# Patient Record
Sex: Male | Born: 1955 | Race: White | Hispanic: No | Marital: Married | Smoking: Former smoker
Health system: Southern US, Community
[De-identification: ages and names within clinical notes are randomized; demographics above are authoritative.]

## PROBLEM LIST (undated history)

## (undated) DIAGNOSIS — E785 Hyperlipidemia, unspecified: Secondary | ICD-10-CM

## (undated) HISTORY — PX: HIP SURGERY: SHX245

## (undated) HISTORY — PX: OTHER SURGICAL HISTORY: SHX169

---

## 2021-06-05 ENCOUNTER — Encounter (HOSPITAL_COMMUNITY): Payer: Self-pay | Admitting: Emergency Medicine

## 2021-06-05 ENCOUNTER — Emergency Department (HOSPITAL_COMMUNITY): Payer: No Typology Code available for payment source

## 2021-06-05 ENCOUNTER — Emergency Department (HOSPITAL_COMMUNITY)
Admission: EM | Admit: 2021-06-05 | Discharge: 2021-06-05 | Disposition: A | Discharge status: Home / Self Care | Payer: No Typology Code available for payment source | Attending: Emergency Medicine | Admitting: Emergency Medicine

## 2021-06-05 ENCOUNTER — Other Ambulatory Visit: Payer: Self-pay

## 2021-06-05 DIAGNOSIS — S8992XA Unspecified injury of left lower leg, initial encounter: Secondary | ICD-10-CM | POA: Diagnosis present

## 2021-06-05 DIAGNOSIS — Y92410 Unspecified street and highway as the place of occurrence of the external cause: Secondary | ICD-10-CM | POA: Diagnosis not present

## 2021-06-05 DIAGNOSIS — Z23 Encounter for immunization: Secondary | ICD-10-CM | POA: Insufficient documentation

## 2021-06-05 DIAGNOSIS — S81812A Laceration without foreign body, left lower leg, initial encounter: Secondary | ICD-10-CM | POA: Diagnosis not present

## 2021-06-05 DIAGNOSIS — Z87891 Personal history of nicotine dependence: Secondary | ICD-10-CM | POA: Insufficient documentation

## 2021-06-05 MED ORDER — TETANUS-DIPHTH-ACELL PERTUSSIS 5-2.5-18.5 LF-MCG/0.5 IM SUSY
0.5000 mL | PREFILLED_SYRINGE | Freq: Once | INTRAMUSCULAR | Status: AC
  Administered 2021-06-05: 0.5 mL via INTRAMUSCULAR
  Filled 2021-06-05: qty 0.5
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
## 2021-06-05 NOTE — Discharge Instructions (Addendum)
You were evaluated in the Emergency Department and after careful evaluation, we did not find any emergent condition requiring admission or further testing in the hospital.  Your exam/testing today was overall reassuring.  X-ray did not show any broken bones or emergencies.  We repaired your laceration here in the emergency department.  You will need your sutures removed in 7 to 10 days by healthcare professional.  Please return to the Emergency Department if you experience any worsening of your condition.  Thank you for allowing [place] to be a part of your care.
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
## 2021-06-20 ENCOUNTER — Encounter: Payer: Self-pay | Admitting: Emergency Medicine

## 2021-06-20 ENCOUNTER — Ambulatory Visit
Admission: EM | Admit: 2021-06-20 | Discharge: 2021-06-20 | Disposition: A | Discharge status: Home / Self Care | Payer: No Typology Code available for payment source

## 2021-06-20 ENCOUNTER — Other Ambulatory Visit: Payer: Self-pay

## 2021-06-20 ENCOUNTER — Ambulatory Visit (INDEPENDENT_AMBULATORY_CARE_PROVIDER_SITE_OTHER): Payer: No Typology Code available for payment source

## 2021-06-20 DIAGNOSIS — R079 Chest pain, unspecified: Secondary | ICD-10-CM

## 2021-06-20 DIAGNOSIS — R0781 Pleurodynia: Secondary | ICD-10-CM
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
## 2021-06-20 NOTE — Discharge Instructions (Addendum)
Your x-ray today was negative for any injury or lung involvement  Can take 600 to 800 mg of ibuprofen every 8 hours with food as needed for pain management  May also use over-the-counter muscle rub such as lidocaine or icy hot for additional comfort  Can place heat or ice over the affected area and 15-minute intervals for additional comfort  Pain should continue to improve over time

## 2021-07-04 ENCOUNTER — Other Ambulatory Visit: Payer: Self-pay

## 2021-07-04 ENCOUNTER — Ambulatory Visit
Admission: EM | Admit: 2021-07-04 | Discharge: 2021-07-04 | Disposition: A | Discharge status: Home / Self Care | Payer: No Typology Code available for payment source | Attending: Internal Medicine | Admitting: Internal Medicine

## 2021-07-04 DIAGNOSIS — L03116 Cellulitis of left lower limb: Secondary | ICD-10-CM | POA: Diagnosis not present

## 2021-07-04 HISTORY — DX: Hyperlipidemia, unspecified: E78.5

## 2021-07-04 MED ORDER — CEPHALEXIN 500 MG PO CAPS: 500.0000 mg | ORAL_CAPSULE | Freq: Four times a day (QID) | ORAL | 0 refills | Status: DC

## 2021-07-04 NOTE — Discharge Instructions (Addendum)
You have been prescribed cephalexin antibiotic for left leg infection. Please follow up if no improvement in leg appearance. Go to the hospital if appearance of leg becomes worse.
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
## 2021-07-10 ENCOUNTER — Encounter (HOSPITAL_COMMUNITY): Payer: Self-pay | Admitting: *Deleted

## 2021-07-10 ENCOUNTER — Inpatient Hospital Stay (HOSPITAL_COMMUNITY)
Admission: EM | Admit: 2021-07-10 | Discharge: 2021-07-13 | DRG: 602 | Disposition: A | Discharge status: Home / Self Care | Payer: No Typology Code available for payment source | Attending: Family Medicine | Admitting: Family Medicine

## 2021-07-10 ENCOUNTER — Other Ambulatory Visit: Payer: Self-pay

## 2021-07-10 DIAGNOSIS — S81812S Laceration without foreign body, left lower leg, sequela: Secondary | ICD-10-CM | POA: Diagnosis not present

## 2021-07-10 DIAGNOSIS — E782 Mixed hyperlipidemia: Secondary | ICD-10-CM

## 2021-07-10 DIAGNOSIS — L03116 Cellulitis of left lower limb: Principal | ICD-10-CM | POA: Diagnosis present

## 2021-07-10 DIAGNOSIS — Z87891 Personal history of nicotine dependence: Secondary | ICD-10-CM

## 2021-07-10 DIAGNOSIS — Z6841 Body Mass Index (BMI) 40.0 and over, adult: Secondary | ICD-10-CM

## 2021-07-10 DIAGNOSIS — E785 Hyperlipidemia, unspecified: Secondary | ICD-10-CM | POA: Diagnosis present

## 2021-07-10 DIAGNOSIS — D72829 Elevated white blood cell count, unspecified: Secondary | ICD-10-CM

## 2021-07-10 DIAGNOSIS — U071 COVID-19: Secondary | ICD-10-CM | POA: Diagnosis present

## 2021-07-10 DIAGNOSIS — S81802A Unspecified open wound, left lower leg, initial encounter: Secondary | ICD-10-CM

## 2021-07-10 DIAGNOSIS — Z79899 Other long term (current) drug therapy: Secondary | ICD-10-CM

## 2021-07-10 LAB — CBC
HCT: 44.8 % (ref 39.0–52.0)
Hemoglobin: 14.5 g/dL (ref 13.0–17.0)
MCH: 29.2 pg (ref 26.0–34.0)
MCHC: 32.4 g/dL (ref 30.0–36.0)
MCV: 90.1 fL (ref 80.0–100.0)
Platelets: 371 10*3/uL (ref 150–400)
RBC: 4.97 MIL/uL (ref 4.22–5.81)
RDW: 13.6 % (ref 11.5–15.5)
WBC: 13.2 10*3/uL — ABNORMAL HIGH (ref 4.0–10.5)
nRBC: 0 % (ref 0.0–0.2)

## 2021-07-10 LAB — BASIC METABOLIC PANEL
Anion gap: 6 (ref 5–15)
BUN: 16 mg/dL (ref 8–23)
CO2: 27 mmol/L (ref 22–32)
Calcium: 9.3 mg/dL (ref 8.9–10.3)
Chloride: 102 mmol/L (ref 98–111)
Creatinine, Ser: 0.81 mg/dL (ref 0.61–1.24)
GFR, Estimated: >60 mL/min (ref >60)
Glucose, Bld: 94 mg/dL (ref 70–99)
Potassium: 4.2 mmol/L (ref 3.5–5.1)
Sodium: 135 mmol/L (ref 135–145)

## 2021-07-10 MED ORDER — SODIUM CHLORIDE 0.9 % IV SOLN
2.0000 g | INTRAVENOUS | Status: DC
  Administered 2021-07-10 – 2021-07-12 (×3): 2 g via INTRAVENOUS
  Filled 2021-07-10 (×3): qty 20

## 2021-07-10 MED ORDER — VANCOMYCIN HCL IN DEXTROSE 1-5 GM/200ML-% IV SOLN
1000.0000 mg | Freq: Once | INTRAVENOUS | Status: AC
  Administered 2021-07-10: 1000 mg via INTRAVENOUS
  Filled 2021-07-10: qty 200

## 2021-07-10 MED ORDER — ATORVASTATIN CALCIUM 20 MG PO TABS: 20.0000 mg | ORAL_TABLET | Freq: Every day | ORAL | Status: DC

## 2021-07-10 MED ORDER — ENOXAPARIN SODIUM 80 MG/0.8ML IJ SOSY
70.0000 mg | PREFILLED_SYRINGE | INTRAMUSCULAR | Status: DC
  Administered 2021-07-10 – 2021-07-12 (×3): 70 mg via SUBCUTANEOUS
  Filled 2021-07-10 (×3): qty 0.8

## 2021-07-10 MED ORDER — PIPERACILLIN-TAZOBACTAM 3.375 G IVPB 30 MIN
3.3750 g | Freq: Once | INTRAVENOUS | Status: AC
  Administered 2021-07-10: 3.375 g via INTRAVENOUS
  Filled 2021-07-10: qty 50

## 2021-07-10 MED ORDER — ENOXAPARIN SODIUM 40 MG/0.4ML IJ SOSY: 40.0000 mg | PREFILLED_SYRINGE | INTRAMUSCULAR | Status: DC
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
## 2021-07-10 NOTE — ED Provider Notes (Signed)
Pt given IV antibiotics.  I spoke with Hospitalist who will see for admission    George Reese 07/10/21 1925    Cathren Laine, MD 07/11/21 1332
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
## 2021-07-11 ENCOUNTER — Inpatient Hospital Stay (HOSPITAL_COMMUNITY): Payer: No Typology Code available for payment source

## 2021-07-11 ENCOUNTER — Encounter (HOSPITAL_COMMUNITY): Payer: Self-pay | Admitting: Internal Medicine

## 2021-07-11 LAB — CBC
HCT: 43.2 % (ref 39.0–52.0)
Hemoglobin: 14.1 g/dL (ref 13.0–17.0)
MCH: 29.4 pg (ref 26.0–34.0)
MCHC: 32.6 g/dL (ref 30.0–36.0)
MCV: 90.2 fL (ref 80.0–100.0)
Platelets: 370 10*3/uL (ref 150–400)
RBC: 4.79 MIL/uL (ref 4.22–5.81)
RDW: 13.7 % (ref 11.5–15.5)
WBC: 13.4 10*3/uL — ABNORMAL HIGH (ref 4.0–10.5)
nRBC: 0 % (ref 0.0–0.2)

## 2021-07-11 LAB — COMPREHENSIVE METABOLIC PANEL
ALT: 33 U/L (ref 0–44)
AST: 19 U/L (ref 15–41)
Albumin: 3.7 g/dL (ref 3.5–5.0)
Alkaline Phosphatase: 52 U/L (ref 38–126)
Anion gap: 9 (ref 5–15)
BUN: 13 mg/dL (ref 8–23)
CO2: 26 mmol/L (ref 22–32)
Calcium: 9.2 mg/dL (ref 8.9–10.3)
Chloride: 102 mmol/L (ref 98–111)
Creatinine, Ser: 0.84 mg/dL (ref 0.61–1.24)
GFR, Estimated: >60 mL/min (ref >60)
Glucose, Bld: 118 mg/dL — ABNORMAL HIGH (ref 70–99)
Potassium: 4.2 mmol/L (ref 3.5–5.1)
Sodium: 137 mmol/L (ref 135–145)
Total Bilirubin: 0.5 mg/dL (ref 0.3–1.2)
Total Protein: 7.5 g/dL (ref 6.5–8.1)

## 2021-07-11 LAB — APTT: aPTT: 30 seconds (ref 24–36)

## 2021-07-11 LAB — PHOSPHORUS: Phosphorus: 4.1 mg/dL (ref 2.5–4.6)

## 2021-07-11 LAB — PREALBUMIN: Prealbumin: 20.5 mg/dL (ref 18–38)

## 2021-07-11 LAB — SARS CORONAVIRUS 2 (TAT 6-24 HRS): SARS Coronavirus 2: POSITIVE — AB

## 2021-07-11 LAB — C-REACTIVE PROTEIN: CRP: 1.8 mg/dL — ABNORMAL HIGH (ref <1.0)

## 2021-07-11 LAB — PROTIME-INR
INR: 1 (ref 0.8–1.2)
Prothrombin Time: 13.4 seconds (ref 11.4–15.2)

## 2021-07-11 LAB — SEDIMENTATION RATE: Sed Rate: 37 mm/hr — ABNORMAL HIGH (ref 0–16)

## 2021-07-11 LAB — MAGNESIUM: Magnesium: 2.3 mg/dL (ref 1.7–2.4)

## 2021-07-11 LAB — HIV ANTIBODY (ROUTINE TESTING W REFLEX): HIV Screen 4th Generation wRfx: NONREACTIVE

## 2021-07-11 MED ORDER — SODIUM CHLORIDE 0.9 % IV SOLN: 2.0000 g | INTRAVENOUS | Status: DC

## 2021-07-11 MED ORDER — NIRMATRELVIR/RITONAVIR (PAXLOVID)TABLET
3.0000 | ORAL_TABLET | Freq: Two times a day (BID) | ORAL | Status: DC
  Administered 2021-07-11 – 2021-07-13 (×4): 3 via ORAL
  Filled 2021-07-11: qty 30

## 2021-07-11 MED ORDER — PROSOURCE PLUS PO LIQD
30.0000 mL | Freq: Three times a day (TID) | ORAL | Status: DC
  Administered 2021-07-11 – 2021-07-13 (×6): 30 mL via ORAL
  Filled 2021-07-11 (×6): qty 30

## 2021-07-11 MED ORDER — METRONIDAZOLE 500 MG PO TABS
500.0000 mg | ORAL_TABLET | Freq: Two times a day (BID) | ORAL | Status: DC
Start: 2021-07-11 — End: 2021-07-13
  Administered 2021-07-11 – 2021-07-13 (×5): 500 mg via ORAL
  Filled 2021-07-11 (×5): qty 1

## 2021-07-11 MED ORDER — VANCOMYCIN HCL 1250 MG/250ML IV SOLN
1250.0000 mg | Freq: Two times a day (BID) | INTRAVENOUS | Status: DC
  Administered 2021-07-11 – 2021-07-13 (×5): 1250 mg via INTRAVENOUS
  Filled 2021-07-11 (×5): qty 250

## 2021-07-11 MED ORDER — JUVEN PO PACK
1.0000 | PACK | Freq: Two times a day (BID) | ORAL | Status: DC
  Administered 2021-07-11 – 2021-07-13 (×4): 1 via ORAL
  Filled 2021-07-11 (×4): qty 1

## 2021-07-11 NOTE — Progress Notes (Signed)
Initial Nutrition Assessment  DOCUMENTATION CODES:   Morbid obesity  INTERVENTION:  -1 packet Juven BID to support wound healing   -ProSource Plus 30 ml TID (each 30 ml provides 100 kcal, 15 gr protein)   NUTRITION DIAGNOSIS:   Increased nutrient needs related to wound healing as evidenced by estimated needs.   GOAL:  Patient will meet greater than or equal to 90% of their needs   MONITOR:  PO intake, Supplement acceptance, Labs, Skin, Weight trends  REASON FOR ASSESSMENT:   Consult Wound healing  ASSESSMENT: Patient is a 65 yo male with history of obesity, hyperlipidemia. He presents with a left lower leg wound s/p failure of outpatient treatment per MD.    Patient has a very good appetite and intake reported 100% of meals consumed. No barriers identified to meeting estimated nutrition needs. Emphasized the importance of healthy diet and lean proteins.   Weight range 132-136 kg. Current wt. 134.8 kg. Weight loss would be beneficial.   Medications reviewed.  Labs: BMP Latest Ref Rng & Units 07/11/2021 07/10/2021  Glucose 70 - 99 mg/dL 606(T) 94  BUN 8 - 23 mg/dL 13 16  Creatinine 0.16 - 1.24 mg/dL 0.10 9.32  Sodium 355 - 145 mmol/L 137 135  Potassium 3.5 - 5.1 mmol/L 4.2 4.2  Chloride 98 - 111 mmol/L 102 102  CO2 22 - 32 mmol/L 26 27  Calcium 8.9 - 10.3 mg/dL 9.2 9.3     NUTRITION - FOCUSED PHYSICAL EXAM: Nutrition-Focused physical exam completed. Findings are no fat depletion, no muscle depletion, and left lower leg edema.    Diet Order:   Diet Order             Diet Heart Room service appropriate? Yes; Fluid consistency: Thin  Diet effective now                   EDUCATION NEEDS:  Education needs have been addressed  Skin:  Skin Assessment: Skin Integrity Issues: Skin Integrity Issues:: Other (Comment) Other: wound left lower leg  Last BM:  8/29  Height:   Ht Readings from Last 1 Encounters:  07/10/21 5\' 8"  (1.727 m)    Weight:   Wt  Readings from Last 1 Encounters:  07/10/21 134.8 kg    Ideal Body Weight:   64 kg  BMI:  Body mass index is 45.19 kg/m.  Estimated Nutritional Needs:   Kcal:  2300-2500  Protein:  130-145 gr  Fluid:  2.3-2.5 liters daily   07/12/21 MS,RD,CSG,LDN Contact: Royann Shivers

## 2021-07-11 NOTE — Progress Notes (Signed)
Pharmacy Antibiotic Note  George Reese is a 65 y.o. 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  Pharmacy has been consulted for Vanco dosing.  Plan: Vancomycin 1250 mg IV every 12 hours. Monitor labs, c/s, and vanco level as indicated.  Height: 5\' 8"  (172.7 cm) Weight: 134.8 kg (297 lb 2.9 oz) IBW/kg (Calculated) : 68.4  Temp (24hrs), Avg:98.3 F (36.8 C), Min:98.1 F (36.7 C), Max:98.5 F (36.9 C)  Recent Labs  Lab 07/10/21 1728 07/11/21 0500  WBC 13.2* 13.4*  CREATININE 0.81 0.84    Estimated Creatinine Clearance: 120.9 mL/min (by C-G formula based on SCr of 0.84 mg/dL).    No Known Allergies  Antimicrobials this admission: Vanco 8/30 >>  CTX 8/29 >> Flagyl 8/30    Thank you for allowing pharmacy to be a part of this patient's care.  9/30 07/11/2021 10:55 AM

## 2021-07-11 NOTE — Progress Notes (Signed)
Inpatient Diabetes Program Recommendations  AACE/ADA: New Consensus Statement on Inpatient Glycemic Control  Target Ranges:  Prepandial:   less than 140 mg/dL      Peak postprandial:   less than 180 mg/dL (1-2 hours)      Critically ill patients:  140 - 180 mg/dL  Results for George Reese, George Reese (MRN [id number]) as of 07/11/2021 08:43  Ref. Range 07/10/2021 17:28 07/11/2021 05:00  Glucose Latest Ref Range: 70 - 99 mg/dL 94 325 (H)   Review of Glycemic Control  Diabetes history: NO Outpatient Diabetes medications: NA Current orders for Inpatient glycemic control: None  NOTE: Noted consult for diabetes coordinator per lower extremity wound order set. Per chart, patient does not have any hx of DM. Will sign off consult. Please reconsult if needs arise.  Thanks, Orlando Penner, RN, MSN, CDE Diabetes Coordinator Inpatient Diabetes Program [phone] (Team Pager from 8am to 5pm)
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
## 2021-07-12 LAB — CBC
HCT: 45.5 % (ref 39.0–52.0)
Hemoglobin: 14.7 g/dL (ref 13.0–17.0)
MCH: 29.5 pg (ref 26.0–34.0)
MCHC: 32.3 g/dL (ref 30.0–36.0)
MCV: 91.2 fL (ref 80.0–100.0)
Platelets: 375 10*3/uL (ref 150–400)
RBC: 4.99 MIL/uL (ref 4.22–5.81)
RDW: 13.4 % (ref 11.5–15.5)
WBC: 14.1 10*3/uL — ABNORMAL HIGH (ref 4.0–10.5)
nRBC: 0 % (ref 0.0–0.2)

## 2021-07-12 LAB — COMPREHENSIVE METABOLIC PANEL
ALT: 32 U/L (ref 0–44)
AST: 18 U/L (ref 15–41)
Albumin: 3.9 g/dL (ref 3.5–5.0)
Alkaline Phosphatase: 56 U/L (ref 38–126)
Anion gap: 11 (ref 5–15)
BUN: 16 mg/dL (ref 8–23)
CO2: 26 mmol/L (ref 22–32)
Calcium: 9.6 mg/dL (ref 8.9–10.3)
Chloride: 101 mmol/L (ref 98–111)
Creatinine, Ser: 0.88 mg/dL (ref 0.61–1.24)
GFR, Estimated: >60 mL/min (ref >60)
Glucose, Bld: 98 mg/dL (ref 70–99)
Potassium: 4.5 mmol/L (ref 3.5–5.1)
Sodium: 138 mmol/L (ref 135–145)
Total Bilirubin: 0.6 mg/dL (ref 0.3–1.2)
Total Protein: 8 g/dL (ref 6.5–8.1)

## 2021-07-12 LAB — C-REACTIVE PROTEIN: CRP: 2.6 mg/dL — ABNORMAL HIGH (ref <1.0)

## 2021-07-12 MED ORDER — HYDROCODONE-ACETAMINOPHEN 5-325 MG PO TABS
1.0000 | ORAL_TABLET | Freq: Four times a day (QID) | ORAL | Status: DC | PRN
  Administered 2021-07-12 – 2021-07-13 (×2): 1 via ORAL
  Filled 2021-07-12 (×2): qty 1
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
## 2021-07-13 DIAGNOSIS — U071 COVID-19: Secondary | ICD-10-CM

## 2021-07-13 LAB — C-REACTIVE PROTEIN: CRP: 1.9 mg/dL — ABNORMAL HIGH (ref <1.0)

## 2021-07-13 MED ORDER — NIRMATRELVIR/RITONAVIR (PAXLOVID)TABLET: 3.0000 | ORAL_TABLET | Freq: Two times a day (BID) | ORAL | 0 refills | Status: AC

## 2021-07-13 MED ORDER — ATORVASTATIN CALCIUM 20 MG PO TABS: 20.0000 mg | ORAL_TABLET | Freq: Every day | ORAL | Status: AC

## 2021-07-13 MED ORDER — DOXYCYCLINE HYCLATE 100 MG PO CAPS: 100.0000 mg | ORAL_CAPSULE | Freq: Two times a day (BID) | ORAL | 0 refills | Status: AC

## 2021-07-13 MED ORDER — HYDROCODONE-ACETAMINOPHEN 5-325 MG PO TABS: 1.0000 | ORAL_TABLET | Freq: Four times a day (QID) | ORAL | 0 refills | Status: AC | PRN
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
## 2021-07-13 NOTE — Discharge Instructions (Signed)

## 2023-08-02 IMAGING — MR MR [PERSON_NAME] LOW W/O CM*L*
15 series · 40 of 40 positions shown · non-contrast
Comparison: Tibia fibula radiograph 06/05/2021

CLINICAL DATA: Osteomyelitis suspected, tib/fib, xray done

EXAM:
MRI OF LOWER LEFT EXTREMITY WITHOUT CONTRAST
TECHNIQUE: Multiplanar, multisequence MR imaging of the left lower extremity
was performed. No intravenous contrast was administered.

[Series 2: T1 · coronal · left · 5.0mm · 1.25mm/px · 3 of 30 slices shown (1 of 4)]
[im 1/30]
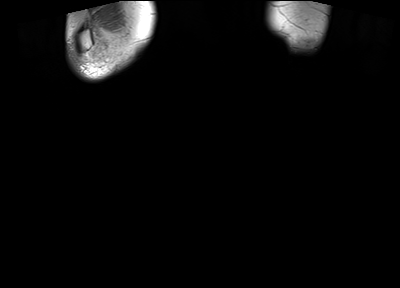
[im 15/30]
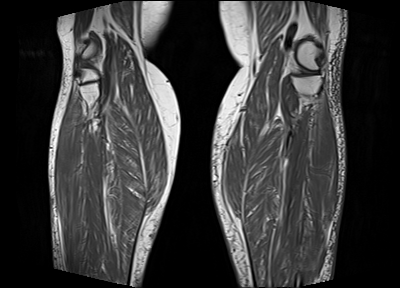
[im 30/30]
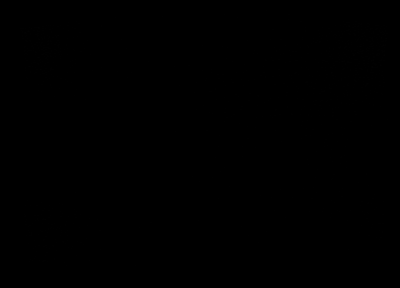

[Series 3: T1 · coronal · left · 5.0mm · 1.18mm/px · 2 of 30 slices shown (2 of 4)]
[im 1/30]
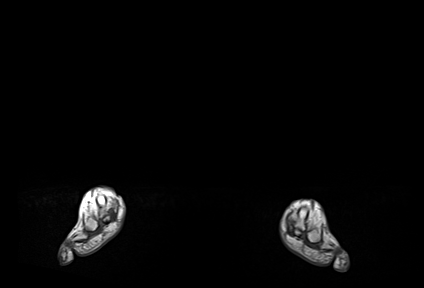
[im 30/30]
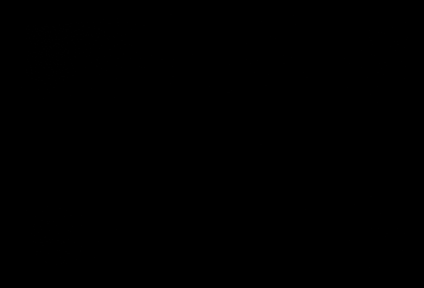

[Series 4: composed cor t1_comp · coronal · left · 6.0mm · 1.18mm/px · 2 of 30 slices shown]
[im 1/30]
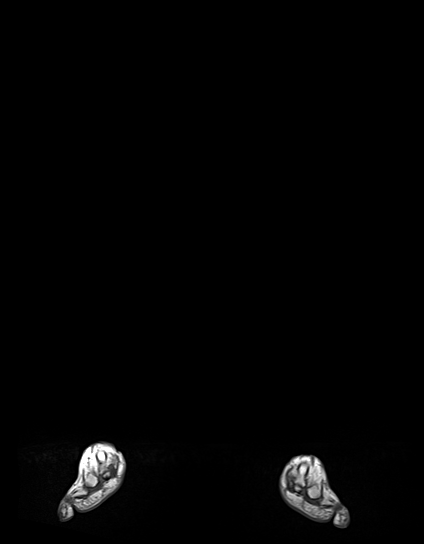
[im 30/30]
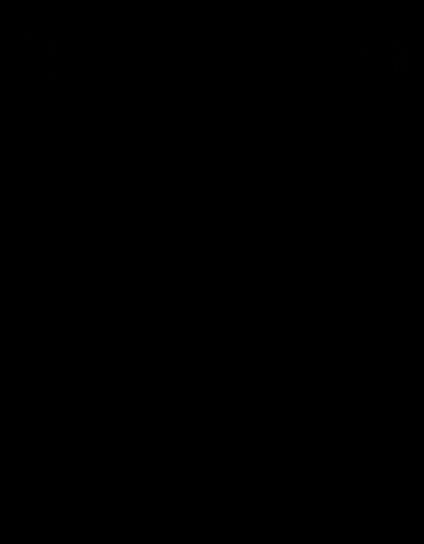

[Series 5: composed cor t1_comp_filt · coronal · left · 6.0mm · 1.18mm/px · 2 of 29 slices shown]
[im 1/29]
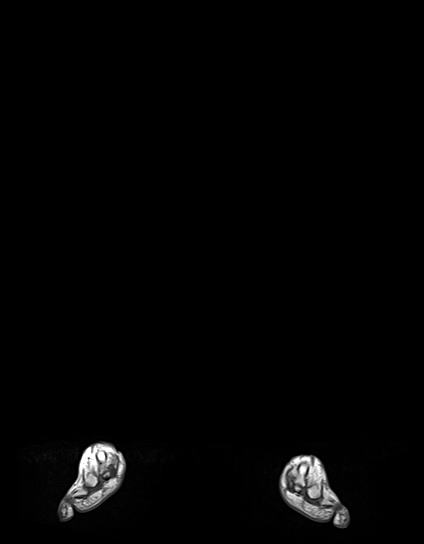
[im 29/29]
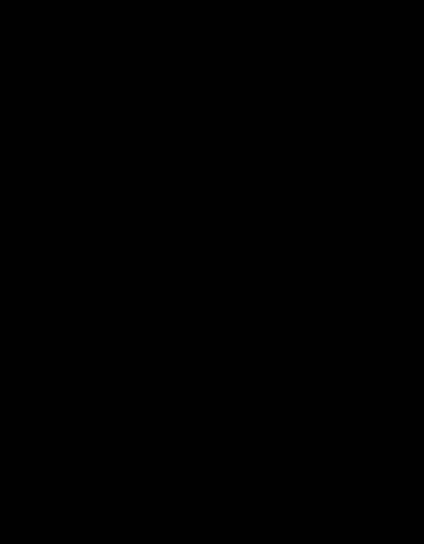

[Series 6: STIR · coronal · left · 5.0mm · 1.25mm/px · 2 of 29 slices shown (1 of 2)]
[im 1/29]
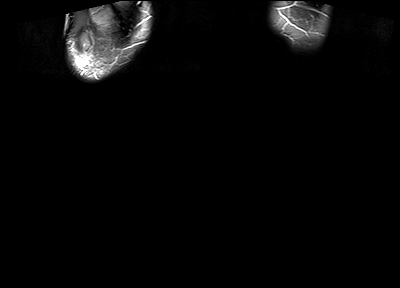
[im 29/29]
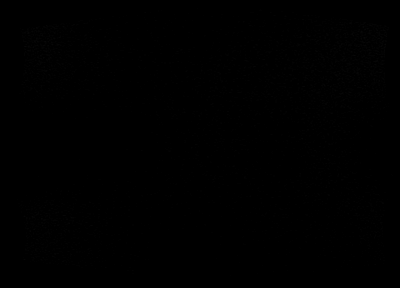

[Series 7: STIR · coronal · left · 5.0mm · 1.25mm/px · 2 of 30 slices shown (2 of 2)]
[im 1/30]
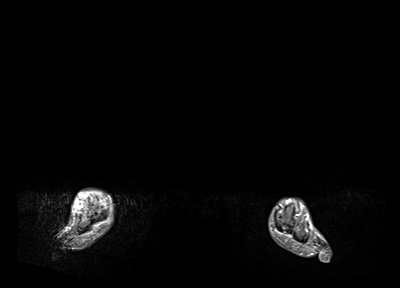
[im 30/30]
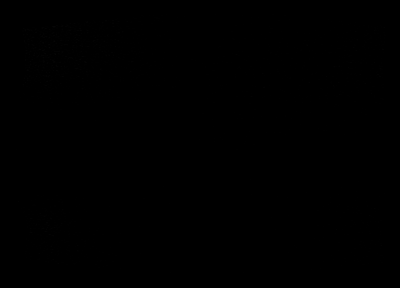

[Series 8: composed cor stir_comp · coronal · left · 6.0mm · 1.25mm/px · 2 of 29 slices shown]
[im 1/29]
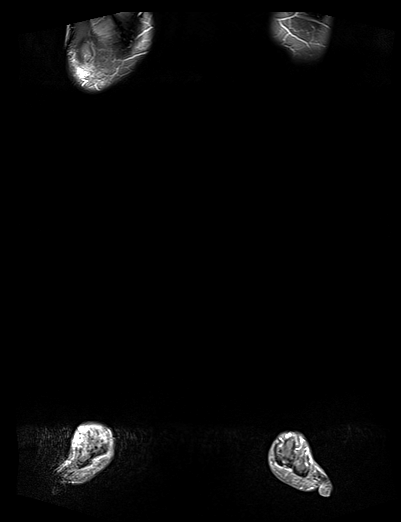
[im 29/29]
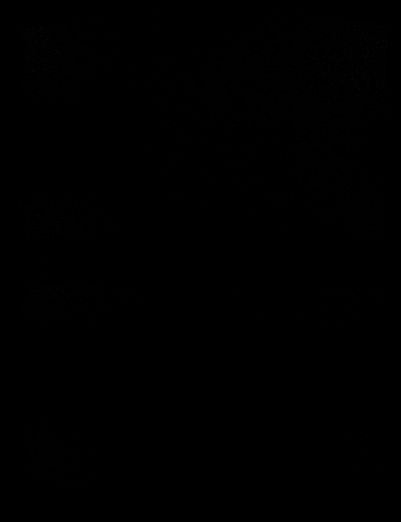

[Series 9: composed cor stir_comp_filt · coronal · left · 6.0mm · 1.25mm/px · 2 of 30 slices shown]
[im 1/30]
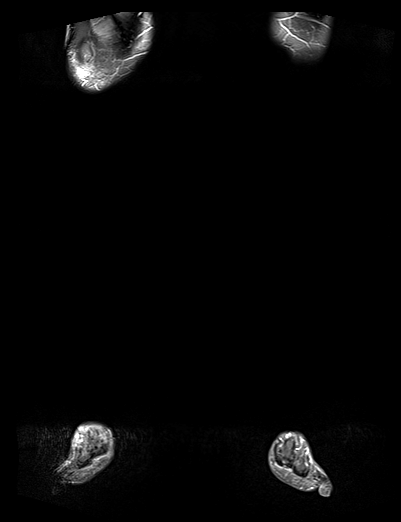
[im 30/30]
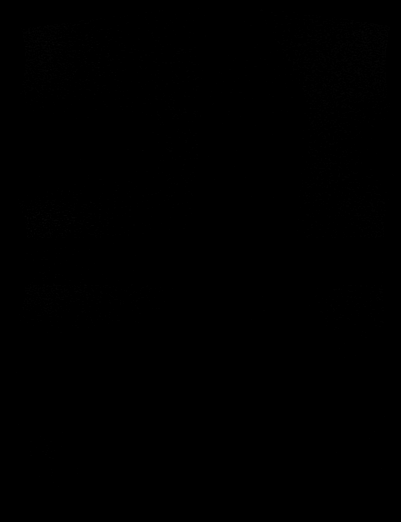

[Series 10: T1 · axial · left · 5.0mm · 0.70mm/px · z∈[+17,+190]mm · 2 of 30 slices shown (3 of 4)]
[im 1/30]
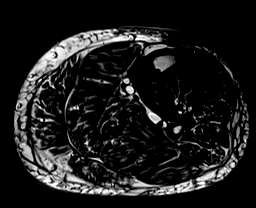
[im 30/30]
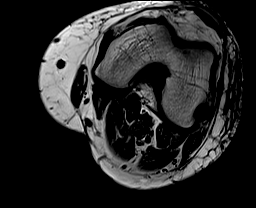

[Series 11: T1 · axial · left · 5.0mm · 0.70mm/px · z∈[-200,+14]mm · 3 of 37 slices shown (4 of 4)]
[im 1/37]
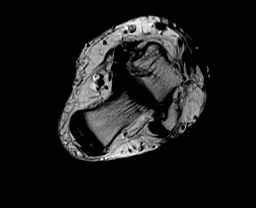
[im 19/37]
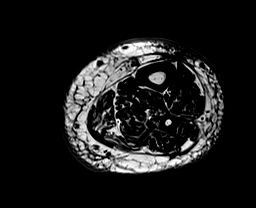
[im 37/37]
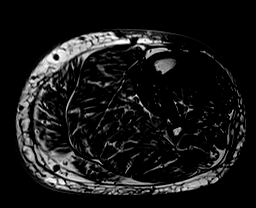

[Series 12: ax t1_comp · axial · left · 5.0mm · 0.70mm/px · z∈[-200,+190]mm · 5 of 67 slices shown]
[im 1/67]
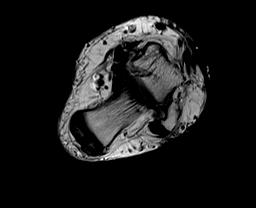
[im 17/67]
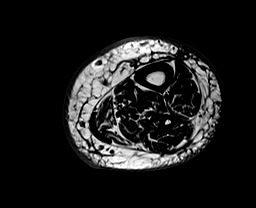
[im 34/67]
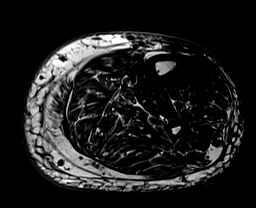
[im 50/67]
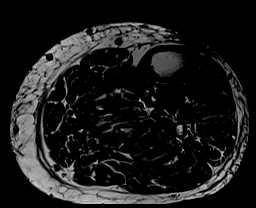
[im 67/67]
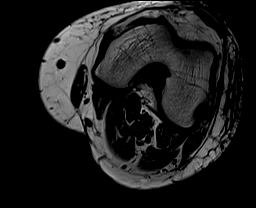

[Series 13: T2 fat-sat · axial · left · 5.0mm · 0.87mm/px · z∈[+17,+190]mm · 2 of 30 slices shown (1 of 3)]
[im 1/30]
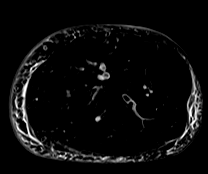
[im 30/30]
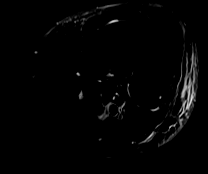

[Series 14: T2 fat-sat · axial · left · 5.0mm · 0.87mm/px · z∈[-200,+14]mm · 3 of 37 slices shown (2 of 3)]
[im 1/37]
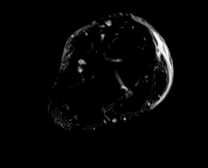
[im 19/37]
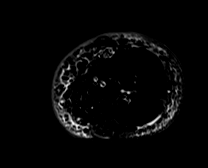
[im 37/37]
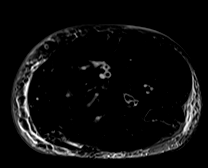

[Series 15: T2 · axial · left · 5.0mm · 0.87mm/px · z∈[-200,+190]mm · 5 of 67 slices shown]
[im 1/67]
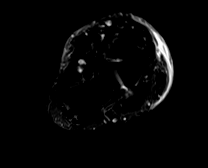
[im 17/67]
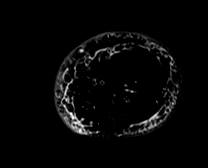
[im 34/67]
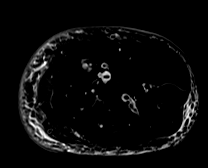
[im 50/67]
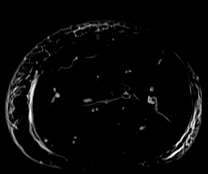
[im 67/67]
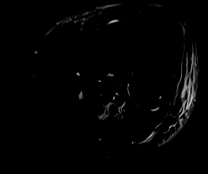

[Series 16: T2 fat-sat · sagittal · left · 4.0mm · 1.15mm/px · 3 of 35 slices shown (3 of 3)]
[im 1/35]
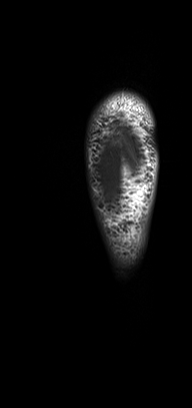
[im 18/35]
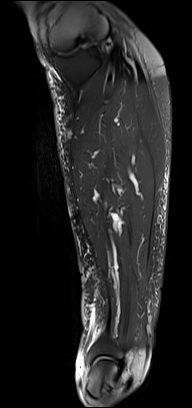
[im 35/35]
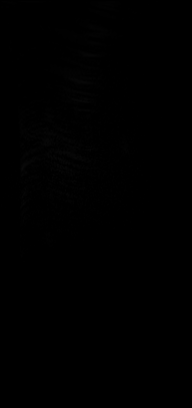

[40 of 40 positions shown; findings below may reference images not displayed]

FINDINGS: Bones/Joint/Cartilage

The cortex is intact. There is no significant marrow signal
alteration. There is minimal periosteal edema at the level of the
marker for the patient's reported open wound along the anteromedial
tibia.

Ligaments

The interosseous membrane is intact.

Muscles and Tendons

There is streaky grade 1 muscle atrophy throughout the lower leg.
There is insertional Achilles tendinosis, and possible partial
low-grade partial tearing and retrocalcaneal bursitis, partially
visualized bilaterally. No intramuscular edema or fluid collection.

Soft tissues

There is diffuse skin thickening and subcutaneous soft tissue
swelling. There is a palpable marker placed overlying a region of
focal soft tissue thickening.
IMPRESSION: Diffuse skin thickening and subcutaneous swelling of the left lower
extremity as can be seen in cellulitis. No evidence of soft tissue
abscess.

Focal soft tissue thickening along the mid anteromedial lower leg,
which correlates with the patient's reported soft tissue wound.
Adjacent mild periosteal edema of the tibia which could be related
to recent trauma or irritation from soft tissue
infectious/inflammatory process. No evidence of cortical or
intramedullary signal change to suggest osteomyelitis.
